# Patient Record
Sex: Male | Born: 1995 | Marital: Single
Health system: Southern US, Community
[De-identification: ages and names within clinical notes are randomized; demographics above are authoritative.]

---

## 2016-03-11 ENCOUNTER — Encounter: Payer: Self-pay | Admitting: Sports Medicine

## 2016-03-11 ENCOUNTER — Ambulatory Visit (INDEPENDENT_AMBULATORY_CARE_PROVIDER_SITE_OTHER): Payer: 59 | Admitting: Sports Medicine

## 2016-03-11 DIAGNOSIS — M79675 Pain in left toe(s): Secondary | ICD-10-CM

## 2016-03-11 DIAGNOSIS — M79674 Pain in right toe(s): Secondary | ICD-10-CM

## 2016-03-11 DIAGNOSIS — L603 Nail dystrophy: Secondary | ICD-10-CM | POA: Diagnosis not present

## 2016-03-11 NOTE — Progress Notes (Signed)
Subjective: Devon Allen is a 20 y.o. male patient seen today in office with complaint of painful thickened and discolored nails. Patient is desiring treatment for nail changes; has tried OTC topicals/Medication in the past with no improvement. Reports that nails are becoming difficult to manage because of the thickness. Admits to history of stubbing nails/dropping dumb-bell on toes. Patient has no other pedal complaints at this time.   There are no active problems to display for this patient.   No current outpatient prescriptions on file prior to visit.   No current facility-administered medications on file prior to visit.     Not on File  Objective: Physical Exam  General: Well developed, nourished, no acute distress, awake, alert and oriented x 3  Vascular: Dorsalis pedis artery 2/4 bilateral, Posterior tibial artery 2/4 bilateral, skin temperature warm to warm proximal to distal bilateral lower extremities, no varicosities, pedal hair present bilateral.  Neurological: Gross sensation present via light touch bilateral.   Dermatological: Skin is warm, dry, and supple bilateral, Nails 1-10 are tender, short thick, and discolored with mild subungal debris most involved at bilateral hallux and right 2nd toenail, no webspace macerations present bilateral, no open lesions present bilateral, no callus/corns/hyperkeratotic tissue present bilateral. No signs of infection bilateral.  Musculoskeletal: No boney deformities noted bilateral. Muscular strength within normal limits without painon range of motion. No pain with calf compression bilateral.  Assessment and Plan:  Problem List Items Addressed This Visit    None    Visit Diagnoses    Nail dystrophy    -  Primary   Relevant Orders   Fungus Culture with Smear   Toe pain, bilateral       Relevant Orders   Fungus Culture with Smear      -Examined patient -Discussed treatment options for painful dystrophic nails  -[facility] -Encouraged good hygiene meanwhile may continue with tea tree oil and vinegar soaks -Patient to return in 4 weeks for follow up evaluation and discussion of fungal culture results or sooner if symptoms worsen.  Devon Islamitorya Kurtis Allen, DPM

## 2016-04-10 ENCOUNTER — Ambulatory Visit: Payer: 59 | Admitting: Sports Medicine

## 2016-04-17 ENCOUNTER — Ambulatory Visit (INDEPENDENT_AMBULATORY_CARE_PROVIDER_SITE_OTHER): Payer: 59 | Admitting: Sports Medicine

## 2016-04-17 ENCOUNTER — Encounter: Payer: Self-pay | Admitting: Sports Medicine

## 2016-04-17 DIAGNOSIS — M79675 Pain in left toe(s): Secondary | ICD-10-CM | POA: Diagnosis not present

## 2016-04-17 DIAGNOSIS — M79674 Pain in right toe(s): Secondary | ICD-10-CM | POA: Diagnosis not present

## 2016-04-17 DIAGNOSIS — B359 Dermatophytosis, unspecified: Secondary | ICD-10-CM | POA: Diagnosis not present

## 2016-04-17 DIAGNOSIS — L603 Nail dystrophy: Secondary | ICD-10-CM

## 2016-04-17 NOTE — Patient Instructions (Signed)
Lamisil for nail mold

## 2016-04-17 NOTE — Progress Notes (Signed)
Subjective: [place] Granieri is a 21 y.o. male patient seen today in office for fungal culture results. Patient has no other pedal complaints at this time.   There are no active problems to display for this patient.   No current outpatient prescriptions on file prior to visit.   No current facility-administered medications on file prior to visit.     Not on File  Objective: Physical Exam  General: Well developed, nourished, no acute distress, awake, alert and oriented x 3  Vascular: Dorsalis pedis artery 2/4 bilateral, Posterior tibial artery 2/4 bilateral, skin temperature warm to warm proximal to distal bilateral lower extremities, no varicosities, pedal hair present bilateral.  Neurological: Gross sensation present via light touch bilateral.   Dermatological: Skin is warm, dry, and supple bilateral, Nails 1-10 are tender, short thick, and discolored with mild subungal debris, no webspace macerations present bilateral, no open lesions present bilateral, no callus/corns/hyperkeratotic tissue present bilateral. No signs of infection bilateral.  Musculoskeletal: No boney deformities noted bilateral. Muscular strength within normal limits without painon range of motion. No pain with calf compression bilateral.  Fungal culture, Suggestive of non dermatophytic mold  Assessment and Plan:  Problem List Items Addressed This Visit    None    Visit Diagnoses    Dermatophytosis    -  Primary   Relevant Orders   Hepatic Function Panel   Nail dystrophy       Toe pain, bilateral          -Examined patient -Discussed treatment options for painful mycotic nails -Patient opt for oral Lamisil with full understanding of medication risks; ordered LFTs for review if within normal limits will proceed with sending Rx to pharmacy for lamisil 250mg  PO daily. Anticipate 12 week course.  -Advised good hygiene habits -Patient to return in 6 weeks for follow up evaluation or sooner if symptoms  worsen.  Asencion Islamitorya Jameil Whitmoyer, DPM

## 2016-04-22 LAB — HEPATIC FUNCTION PANEL
ALT: 18 IU/L (ref 0–44)
AST: 17 IU/L (ref 0–40)
Albumin: 4.6 g/dL (ref 3.5–5.5)
Alkaline Phosphatase: 80 IU/L (ref 39–117)
BILIRUBIN, DIRECT: 0.1 mg/dL (ref 0.00–0.40)
Bilirubin Total: 0.4 mg/dL (ref 0.0–1.2)
Total Protein: 7.3 g/dL (ref 6.0–8.5)

## 2016-04-23 ENCOUNTER — Telehealth: Payer: Self-pay | Admitting: *Deleted

## 2016-04-23 NOTE — Telephone Encounter (Addendum)
-----   Message from Asencion Islamitorya Stover, [place] sent at 04/22/2016  7:22 AM EST ----- Can you let patient know that his bloodwork was normal and send to his pharmacy Lamisil 250mg  po daily x 90 tabs Thanks Dr. Marylene LandStover. 04/23/2016-Left message requesting call back to give instructions and the name of a pharmacy. 04/24/2016-Pt called for results. [facility] pt of results [id number]

## 2016-04-24 MED ORDER — TERBINAFINE HCL 250 MG PO TABS: 250.0000 mg | ORAL_TABLET | Freq: Every day | ORAL | 0 refills | Status: AC

## 2016-05-29 ENCOUNTER — Ambulatory Visit: Payer: 59 | Admitting: Sports Medicine

## 2018-09-07 ENCOUNTER — Other Ambulatory Visit: Payer: Self-pay | Admitting: Urology

## 2018-09-07 DIAGNOSIS — I861 Scrotal varices: Secondary | ICD-10-CM

## 2018-09-07 DIAGNOSIS — R109 Unspecified abdominal pain: Secondary | ICD-10-CM

## 2018-09-07 DIAGNOSIS — N50811 Right testicular pain: Secondary | ICD-10-CM

## 2018-09-20 ENCOUNTER — Other Ambulatory Visit: Payer: Self-pay

## 2018-09-21 ENCOUNTER — Ambulatory Visit
Admission: RE | Admit: 2018-09-21 | Discharge: 2018-09-21 | Disposition: A | Discharge status: Home / Self Care | Payer: Managed Care, Other (non HMO) | Source: Ambulatory Visit | Attending: Urology | Admitting: Urology

## 2018-09-21 DIAGNOSIS — R109 Unspecified abdominal pain: Secondary | ICD-10-CM

## 2018-09-21 DIAGNOSIS — N50811 Right testicular pain: Secondary | ICD-10-CM

## 2021-06-03 IMAGING — US ULTRASOUND SCROTUM DOPPLER COMPLETE
1 series · 13 of 25 positions shown · non-contrast
Comparison: None.

CLINICAL DATA: RIGHT greater than LEFT testicular pain

EXAM:
SCROTAL ULTRASOUND
DOPPLER ULTRASOUND OF THE TESTICLES
TECHNIQUE: Complete ultrasound examination of the testicles, epididymis, and
other scrotal structures was performed. Color and spectral Doppler
ultrasound were also utilized to evaluate blood flow to the
testicles.

[Series 1: ultrasound scrotum doppler complete · 0.06mm/px · 87 acquisitions, 13 frames shown]
[im 1/87]
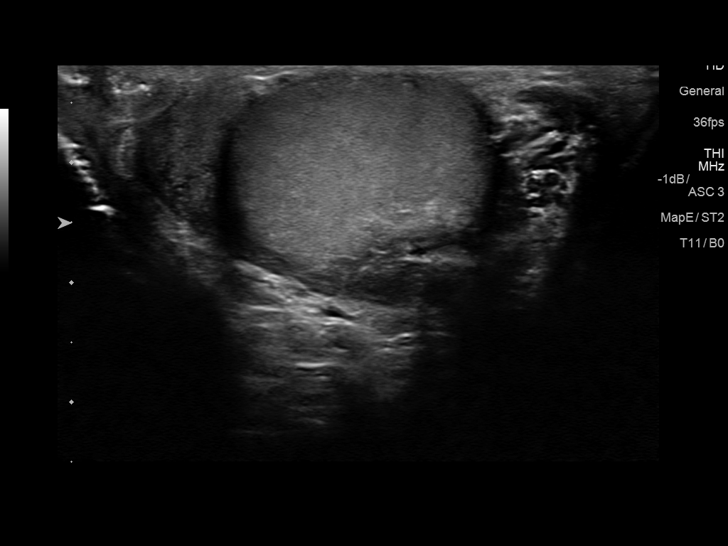
[im 8/87]
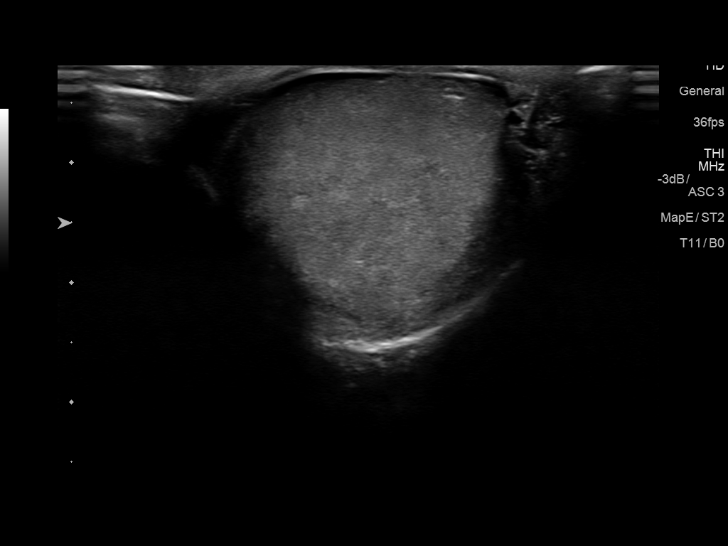
[im 15/87]
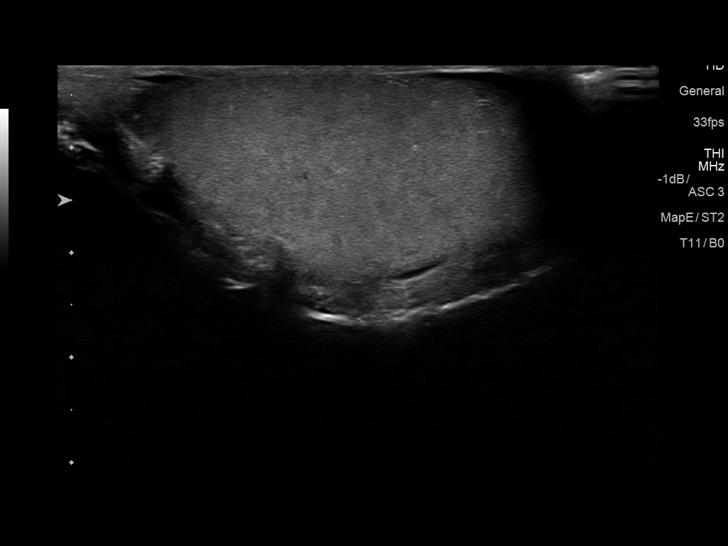
[im 22/87]
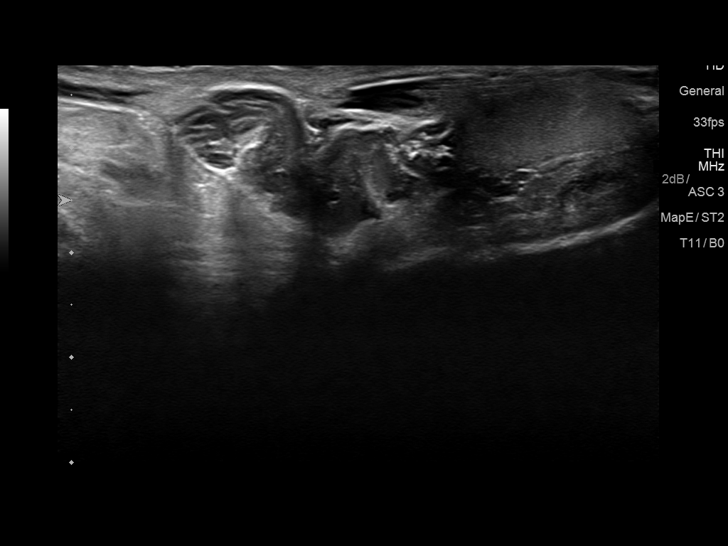
[im 29/87]
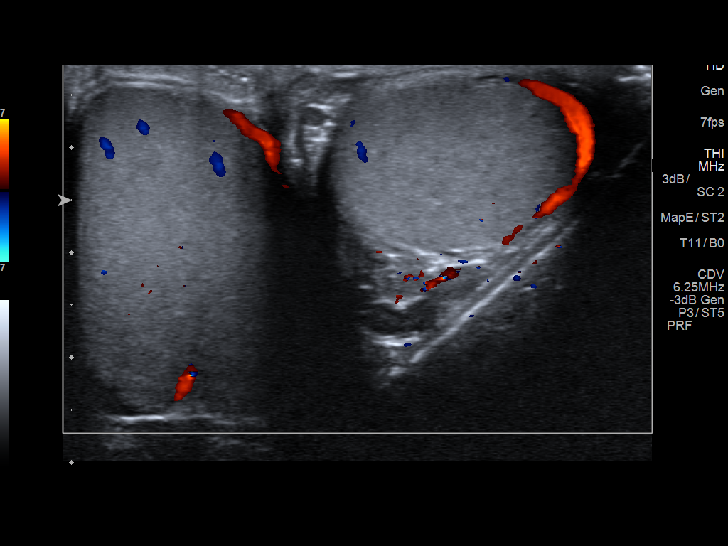
[im 36/87]
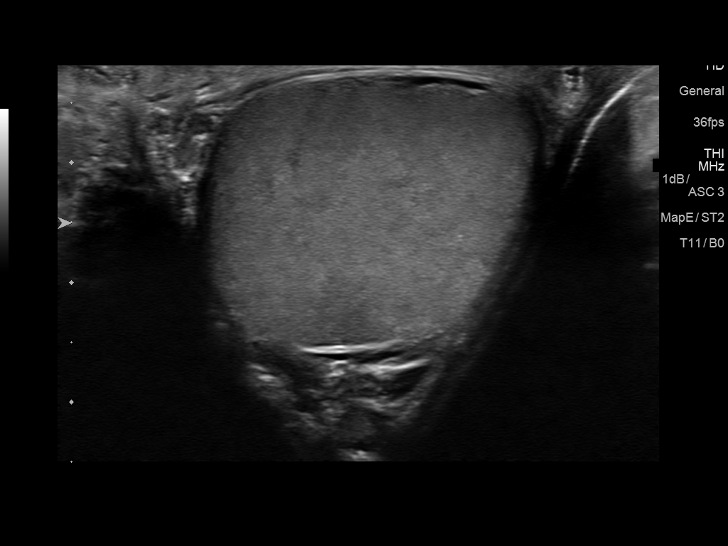
[im 44/87]
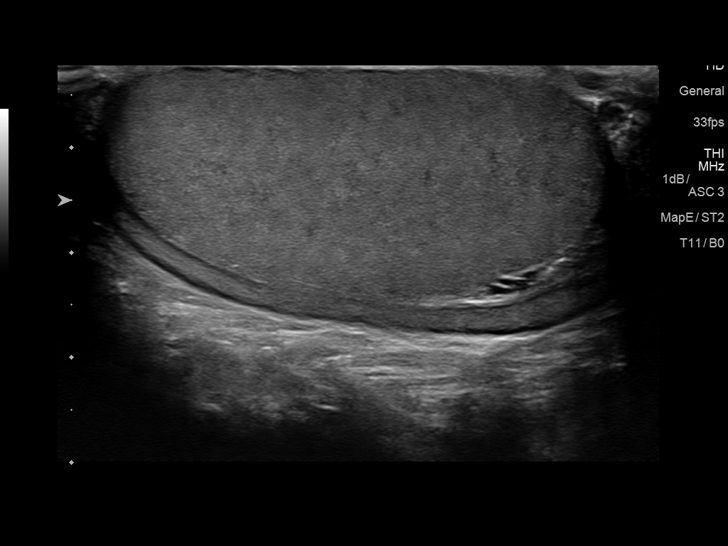
[im 51/87]
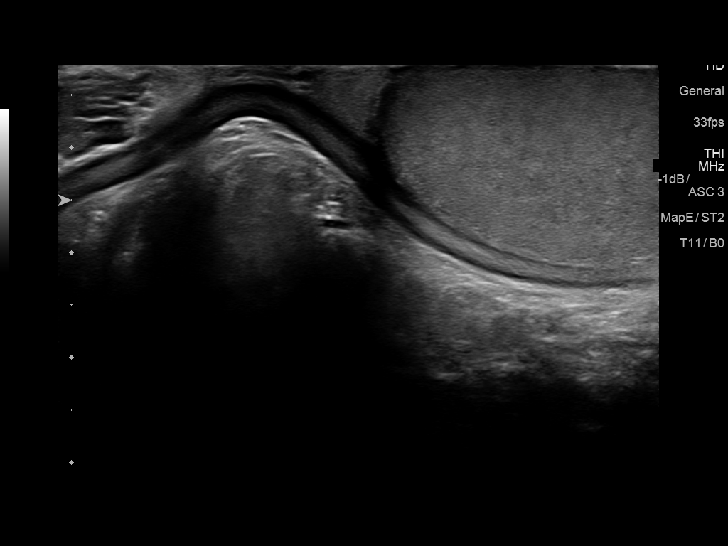
[im 58/87]
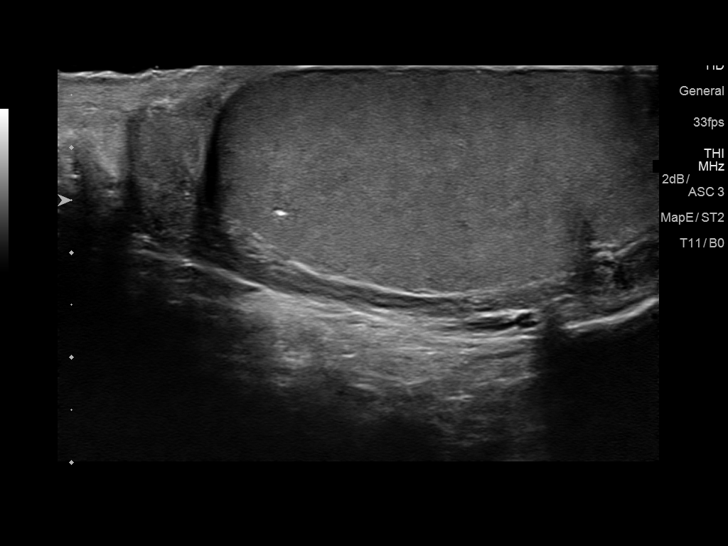
[im 65/87]
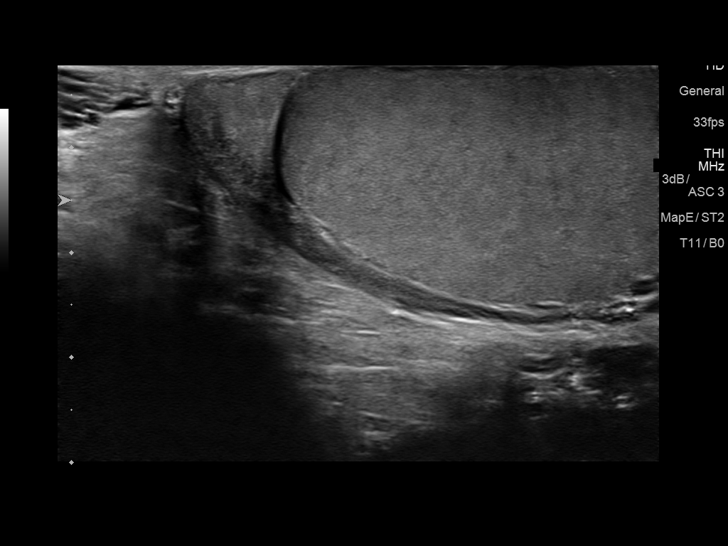
[im 72/87]
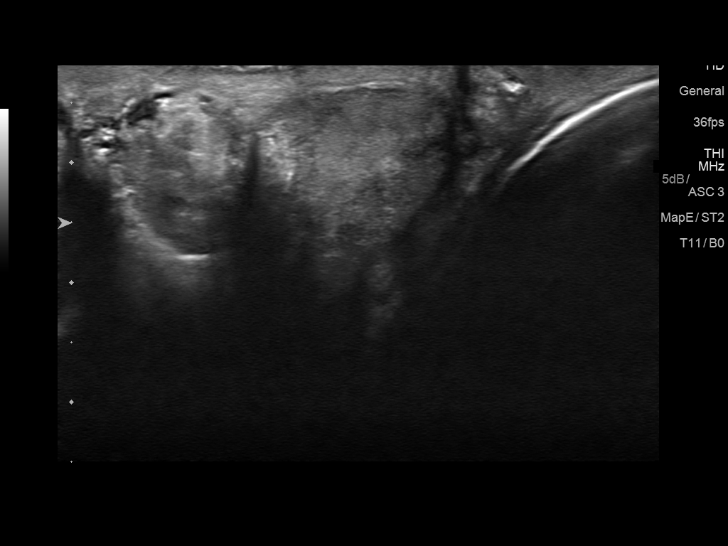
[im 79/87]
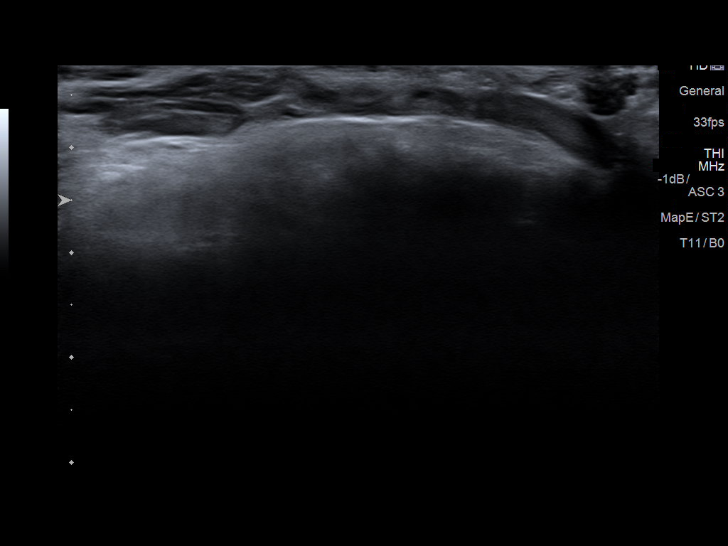
[im 87/87]
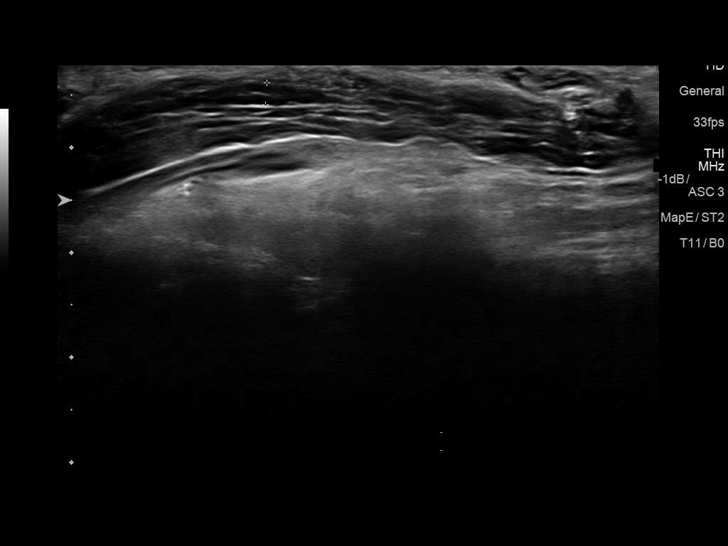

[13 of 25 positions shown; findings below may reference images not displayed]

FINDINGS: Right testicle

Measurements: 4.8 x 2.3 x 3.3 cm. Normal parenchymal echogenicity.
Single calcification 1.5 mm diameter. No mass identified. Internal
blood flow present on color Doppler imaging.

Left testicle

Measurements: 4.8 x 2.1 x 2.8 cm. Normal echogenicity without mass
or calcification. Internal blood flow present on color Doppler
imaging, symmetric with RIGHT

Right epididymis:  Tiny cyst at RIGHT epididymal head 3 mm diameter

Left epididymis:  Tiny cyst LEFT epididymal head 3 mm diameter

Hydrocele:  Absent bilaterally

Varicocele:  Small BILATERAL hydroceles.

Pulsed Doppler interrogation of both testes demonstrates normal low
resistance arterial and venous waveforms bilaterally.
IMPRESSION: Tiny cysts at the epididymal heads bilaterally.

BILATERAL varicoceles.

No evidence of testicular mass, torsion, or inflammatory process.
